# Patient Record
Sex: Male | Born: 1950 | Race: Asian | Hispanic: No | Marital: Married | Smoking: Never smoker
Health system: Southern US, Community
[De-identification: ages and names within clinical notes are randomized; demographics above are authoritative.]

## PROBLEM LIST (undated history)

## (undated) DIAGNOSIS — R21 Rash and other nonspecific skin eruption: Secondary | ICD-10-CM

## (undated) HISTORY — DX: Rash and other nonspecific skin eruption: R21

---

## 2022-03-20 ENCOUNTER — Ambulatory Visit (INDEPENDENT_AMBULATORY_CARE_PROVIDER_SITE_OTHER): Payer: 59 | Admitting: Family Medicine

## 2022-03-20 ENCOUNTER — Encounter: Payer: Self-pay | Admitting: Family Medicine

## 2022-03-20 VITALS — BP 92/60 | HR 70 | Temp 98.0°F | Ht 66.0 in | Wt 128.6 lb

## 2022-03-20 DIAGNOSIS — Z13220 Encounter for screening for lipoid disorders: Secondary | ICD-10-CM

## 2022-03-20 DIAGNOSIS — Z1211 Encounter for screening for malignant neoplasm of colon: Secondary | ICD-10-CM

## 2022-03-20 DIAGNOSIS — Z8639 Personal history of other endocrine, nutritional and metabolic disease: Secondary | ICD-10-CM | POA: Diagnosis not present

## 2022-03-20 DIAGNOSIS — R0981 Nasal congestion: Secondary | ICD-10-CM

## 2022-03-20 DIAGNOSIS — L299 Pruritus, unspecified: Secondary | ICD-10-CM

## 2022-03-20 DIAGNOSIS — Z131 Encounter for screening for diabetes mellitus: Secondary | ICD-10-CM | POA: Diagnosis not present

## 2022-03-20 DIAGNOSIS — Z23 Encounter for immunization: Secondary | ICD-10-CM

## 2022-03-20 DIAGNOSIS — R21 Rash and other nonspecific skin eruption: Secondary | ICD-10-CM

## 2022-03-20 MED ORDER — TRIAMCINOLONE ACETONIDE 0.1 % EX CREA: 1.0000 "application " | TOPICAL_CREAM | Freq: Two times a day (BID) | CUTANEOUS | 0 refills | Status: DC

## 2022-03-20 MED ORDER — FLUTICASONE PROPIONATE 50 MCG/ACT NA SUSP: 2.0000 | Freq: Every day | NASAL | 6 refills | Status: DC

## 2022-03-20 NOTE — Progress Notes (Signed)
? ?Subjective:  ?Patient ID: Chris Sutton, male    DOB: [birth year]  Age: 71 y.o. MRN: [id number] ? ?CC:  ?Chief Complaint  ?Patient presents with  ? Establish Care  ? ? ?HPI ?Chris Sutton presents for  ?New patient to establish care.  ?[place] interpreter present and pt's son Chris Sutton.  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 ?2 grandkids -2 and 5 yo grandchildren - 2 boys.  ?Prior pt of [facility]. Some concerns about prior office with labs, communication - decided to change. Last bloodwork last year.  ?Moved from [place] area of [place] few years ago.  ? ?No chronic meds, medical problems.  ?No hx of HTN, HLD, DM, Cancer.  ? ?Concerns today: ? ?In [place], had ultrasound for thyroid, liver - not done for 3 years.part of physical when living in [place].  ?Hx of thyroid nodules. No thyroid supplement used. No prior thyroid biopsies. ? ?HM: ?Screening options with colonoscopy versus Cologuard discussed. Discussed timing of repeat testing intervals if normal, as well as potential need for diagnostic Colonoscopy if positive Cologuard. Understanding expressed, and chose Cologuard. No FH of colon CA. Last screening - none ? ?Pneumonia vaccine  - today.  ? ?Nasal congestion ?Past 4-5 months, with runny nose in am.  ?Some scratchy throat, min cough.  ? ?Itching of R lower leg, stomach.  ?Past few months. ?Tx: otc lotion.   ? ?History ?There are no problems to display for this patient. ? ?Past Medical History:  ?Diagnosis Date  ? Rash   ? ?History reviewed. No pertinent surgical history. ?No Known Allergies ?Prior to Admission medications   ?Medication Sig Start Date End Date Taking? Authorizing Provider  ?CHLORPHENIRAMINE MALEATE PO Take by mouth as needed.   Yes [provider]  ?Multiple Vitamin (MULTIVITAMIN) tablet Take 1 tablet by mouth daily.   Yes [provider]  ? ?Social History  ? ?Socioeconomic History  ? Marital status: Married  ?  Spouse name: Not on file  ? Number of children: 1  ?  Years of education: Not on file  ? Highest education level: Not on file  ?Occupational History  ? Not on file  ?Tobacco Use  ? Smoking status: Never  ? Smokeless tobacco: Never  ?Vaping Use  ? Vaping Use: Never used  ?Substance and Sexual Activity  ? Alcohol use: Never  ? Drug use: Never  ? Sexual activity: Not Currently  ?Other Topics Concern  ? Not on file  ?Social History Narrative  ? Not on file  ? ?Social Determinants of Health  ? ?Financial Resource Strain: Not on file  ?Food Insecurity: Not on file  ?Transportation Needs: Not on file  ?Physical Activity: Not on file  ?Stress: Not on file  ?Social Connections: Not on file  ?Intimate Partner Violence: Not on file  ? ? ?Review of Systems ?Per HPI.  ? ?Objective:  ? ?Vitals:  ? 03/20/22 0936  ?BP: 92/60  ?Pulse: 70  ?Temp: 98 ?F (36.7 ?C)  ?TempSrc: Temporal  ?SpO2: 98%  ?Weight: 128 lb 9.6 oz (58.3 kg)  ?Height: 5\' 6"  (1.676 m)  ? ? ?Physical Exam ?Vitals reviewed.  ?Constitutional:   ?   Appearance: He is well-developed.  ?HENT:  ?   Head: Normocephalic and atraumatic.  ?   Nose:  ?   Comments: Edematous turbinates bilaterally without active discharge ?Neck:  ?   Vascular: No carotid bruit or JVD.  ?   Comments: Possible nodule/fullness left lower thyroid, nontender.  Slight asymmetry. ?Cardiovascular:  ?   Rate and Rhythm: Normal rate and regular rhythm.  ?   Heart sounds: Normal heart sounds. No murmur heard. ?Pulmonary:  ?   Effort: Pulmonary effort is normal.  ?   Breath sounds: Normal breath sounds. No rales.  ?Musculoskeletal:  ?   Right lower leg: No edema.  ?   Left lower leg: No edema.  ?Skin: ?   General: Skin is warm and dry.  ?   Comments: Faint excoriated areas lower legs bilaterally without open wounds, few small papular, excoriated areas along the abdominal wall.  No open wounds or surrounding erythema.  ?Neurological:  ?   Mental Status: He is alert and oriented to person, place, and time.  ?Psychiatric:     ?   Mood and Affect: Mood normal.   ? ? ? ? ? ?Assessment & Plan:  ?[name] is a 71 y.o. male . ?History of thyroid nodule - Plan: TSH, US THYROID ? -Thyroid nodules by history, check updated TSH, ultrasound ordered. ? ?Screening for diabetes mellitus - Plan: Comprehensive metabolic panel, Hemoglobin A1c ? ?Screening for hyperlipidemia - Plan: Lipid panel ? ?Screen for colon cancer - Plan: Cologuard ? ?Need for pneumococcal vaccination - Plan: Pneumococcal conjugate vaccine 20-valent (Prevnar 20) ? ?Nasal congestion - Plan: fluticasone (FLONASE) 50 MCG/ACT nasal spray ? -Recommended consistent use of Flonase for at least a few weeks to see if that may be more beneficial than short-term trial previously.  RTC precautions if persistent symptoms.  Could consider Atrovent nasal spray as well. ? ?Itching - Plan: triamcinolone cream (KENALOG) 0.1 % ?Rash and nonspecific skin eruption - Plan: triamcinolone cream (KENALOG) 0.1 % ? -Possible dry skin dermatitis.  No sign of infection presently, hydrating lotion discussed with Aveeno or Eucerin, triamcinolone cream if needed temporarily for pruritus.  RTC precautions if persistent. ? ?Recheck 1 month ? ?Meds ordered this encounter  ?Medications  ? fluticasone (FLONASE) 50 MCG/ACT nasal spray  ?  Sig: Place 2 sprays into both nostrils daily.  ?  Dispense:  16 g  ?  Refill:  6  ? triamcinolone cream (KENALOG) 0.1 %  ?  Sig: Apply 1 application. topically 2 (two) times daily.  ?  Dispense:  30 g  ?  Refill:  0  ? ?Patient Instructions  ?Lab visit tomorrow.  ?I will order ultrasound for thyroid nodules, cologuard for colon cancer screening - will be sent to your home.  ?Try flonase nasal spray, for at least 2 weeks. If not helping congestion, return to discuss other options.  ?Aveeno lotion to itching areas, start claritin once per day. Steroid cream if needed.  ?Recheck itching, congestion in next 1 month.  ?Nice meeting you today. Please let me know if you have questions before next visit.  ? ? ? ? ?Signed,   ? ?Meredith Staggers, MD ?[facility], [facility] ?[facility] ?03/20/22 ?10:31 AM ? ? ?

## 2022-03-20 NOTE — Patient Instructions (Addendum)
Lab visit tomorrow.  ?I will order ultrasound for thyroid nodules, cologuard for colon cancer screening - will be sent to your home.  ?Try flonase nasal spray, for at least 2 weeks. If not helping congestion, return to discuss other options.  ?Aveeno lotion to itching areas, start claritin once per day. Steroid cream if needed.  ?Recheck itching, congestion in next 1 month.  ?Nice meeting you today. Please let me know if you have questions before next visit.  ? ?

## 2022-03-21 ENCOUNTER — Other Ambulatory Visit (INDEPENDENT_AMBULATORY_CARE_PROVIDER_SITE_OTHER): Payer: 59

## 2022-03-21 DIAGNOSIS — Z131 Encounter for screening for diabetes mellitus: Secondary | ICD-10-CM | POA: Diagnosis not present

## 2022-03-21 DIAGNOSIS — Z13220 Encounter for screening for lipoid disorders: Secondary | ICD-10-CM

## 2022-03-21 DIAGNOSIS — Z8639 Personal history of other endocrine, nutritional and metabolic disease: Secondary | ICD-10-CM

## 2022-03-21 LAB — COMPREHENSIVE METABOLIC PANEL
ALT: 15 U/L (ref 0–53)
AST: 22 U/L (ref 0–37)
Albumin: 4.4 g/dL (ref 3.5–5.2)
Alkaline Phosphatase: 25 U/L — ABNORMAL LOW (ref 39–117)
BUN: 14 mg/dL (ref 6–23)
CO2: 31 mEq/L (ref 19–32)
Calcium: 9.4 mg/dL (ref 8.4–10.5)
Chloride: 100 mEq/L (ref 96–112)
Creatinine, Ser: 0.85 mg/dL (ref 0.40–1.50)
GFR: 88.99 mL/min (ref >60.00)
Glucose, Bld: 100 mg/dL — ABNORMAL HIGH (ref 70–99)
Potassium: 4.1 mEq/L (ref 3.5–5.1)
Sodium: 138 mEq/L (ref 135–145)
Total Bilirubin: 0.9 mg/dL (ref 0.2–1.2)
Total Protein: 7.7 g/dL (ref 6.0–8.3)

## 2022-03-21 LAB — LIPID PANEL
Cholesterol: 167 mg/dL (ref 0–200)
HDL: 56.1 mg/dL (ref >39.00)
LDL Cholesterol: 97 mg/dL (ref 0–99)
NonHDL: 110.99
Total CHOL/HDL Ratio: 3
Triglycerides: 72 mg/dL (ref 0.0–149.0)
VLDL: 14.4 mg/dL (ref 0.0–40.0)

## 2022-03-21 LAB — HEMOGLOBIN A1C: Hgb A1c MFr Bld: 6 % (ref 4.6–6.5)

## 2022-03-21 LAB — TSH: TSH: 0.83 u[IU]/mL (ref 0.35–5.50)

## 2022-03-21 NOTE — Addendum Note (Signed)
Addended by: Merri Ray R on: 03/21/2022 12:41 PM ? ? Modules accepted: Orders ? ?

## 2022-03-22 ENCOUNTER — Encounter: Payer: Self-pay | Admitting: Family Medicine

## 2022-03-22 ENCOUNTER — Other Ambulatory Visit: Payer: Self-pay

## 2022-03-22 ENCOUNTER — Ambulatory Visit (INDEPENDENT_AMBULATORY_CARE_PROVIDER_SITE_OTHER): Payer: 59 | Admitting: Family Medicine

## 2022-03-22 ENCOUNTER — Emergency Department (INDEPENDENT_AMBULATORY_CARE_PROVIDER_SITE_OTHER): Payer: 59

## 2022-03-22 ENCOUNTER — Emergency Department
Admission: EM | Admit: 2022-03-22 | Discharge: 2022-03-22 | Disposition: A | Discharge status: Home / Self Care | Payer: 59 | Source: Home / Self Care

## 2022-03-22 VITALS — BP 112/74 | HR 76 | Temp 98.1°F | Ht 66.0 in | Wt 129.5 lb

## 2022-03-22 DIAGNOSIS — M62838 Other muscle spasm: Secondary | ICD-10-CM

## 2022-03-22 DIAGNOSIS — M542 Cervicalgia: Secondary | ICD-10-CM

## 2022-03-22 DIAGNOSIS — M545 Low back pain, unspecified: Secondary | ICD-10-CM

## 2022-03-22 MED ORDER — CELECOXIB 100 MG PO CAPS: 100.0000 mg | ORAL_CAPSULE | Freq: Two times a day (BID) | ORAL | 0 refills | Status: AC

## 2022-03-22 MED ORDER — METHYLPREDNISOLONE 4 MG PO TBPK: ORAL_TABLET | ORAL | 0 refills | Status: DC

## 2022-03-22 MED ORDER — BACLOFEN 10 MG PO TABS: 10.0000 mg | ORAL_TABLET | Freq: Three times a day (TID) | ORAL | 0 refills | Status: DC
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
## 2022-03-22 NOTE — Patient Instructions (Signed)
It was very nice to see you today! ? ?Ice packs.  Heat Sunday.   ?Tylenol(acetaminophen) if needed.  ?Naproxen 1 tab twice daily.- no matter what.  ? ?No heavy lifting. ? ? ?PLEASE NOTE: ? ?If you had any lab tests please let us know if you have not heard back within a few days. You may see your results on MyChart before we have a chance to review them but we will give you a call once they are reviewed by [place]. If we ordered any referrals today, please let us know if you have not heard from their office within the next week.  ? ?Please try these tips to maintain a healthy lifestyle: ? ?Eat most of your calories during the day when you are active. Eliminate processed foods including packaged sweets (pies, cakes, cookies), reduce intake of potatoes, white bread, white pasta, and white rice. Look for whole grain options, oat flour or almond flour. ? ?Each meal should contain half fruits/vegetables, one quarter protein, and one quarter carbs (no bigger than a computer mouse). ? ?Cut down on sweet beverages. This includes juice, soda, and sweet tea. Also watch fruit intake, though this is a healthier sweet option, it still contains natural sugar! Limit to 3 servings daily. ? ?Drink at least 1 glass of water with each meal and aim for at least 8 glasses per day ? ?Exercise at least 150 minutes every week.  ice ?

## 2022-03-22 NOTE — Discharge Instructions (Addendum)
Advised/informed patient and son of cervical spine x-ray results this evening with hard copy provided.  Advised patient and son to take medication as directed with food to completion.  Advised patient/son to start Medrol Dosepak tomorrow morning, Saturday, 03/23/2022.  Advised patient may take 1 Celebrex and 1 baclofen tonight.  Beginning tomorrow morning Saturday, 03/23/2022 advised patient/son to take Medrol Dosepak with first dose of Celebrex for the next 5 of 15 days.  Advised may take Baclofen daily or as needed for accompanying muscle spasms of neck.  Encouraged patient to increase daily water intake while taking these medications.  Advised patient/son if symptoms worsen and/or unresolved please follow-up with PCP or here for further evaluation.  ?
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
## 2022-03-25 NOTE — Telephone Encounter (Signed)
Reviewed notes.  Glad the x-ray was ok.  Hopefully he gets feeling better ?

## 2022-03-29 ENCOUNTER — Ambulatory Visit (HOSPITAL_BASED_OUTPATIENT_CLINIC_OR_DEPARTMENT_OTHER)
Admission: RE | Admit: 2022-03-29 | Discharge: 2022-03-29 | Disposition: A | Discharge status: Home / Self Care | Payer: 59 | Source: Ambulatory Visit | Attending: Family Medicine | Admitting: Family Medicine

## 2022-03-29 DIAGNOSIS — E042 Nontoxic multinodular goiter: Secondary | ICD-10-CM | POA: Insufficient documentation

## 2022-03-29 DIAGNOSIS — Z8639 Personal history of other endocrine, nutritional and metabolic disease: Secondary | ICD-10-CM | POA: Diagnosis present

## 2022-04-07 LAB — COLOGUARD: COLOGUARD: NEGATIVE

## 2022-04-09 ENCOUNTER — Telehealth: Payer: Self-pay

## 2022-04-09 NOTE — Telephone Encounter (Signed)
Pt son is seeking a new provider, is asking if you would take him on.  ?Adlai Sinning call back at [phone] ? ?

## 2022-04-09 NOTE — Telephone Encounter (Signed)
Yes, I will agree to see him. Ok to schedule new pt appt - 100min. Thanks.  ?

## 2022-04-10 NOTE — Telephone Encounter (Signed)
Pt son is to be a new patient or Dr Neva Seat, Karleen Hampshire and can be reached at [phone] ?

## 2022-04-17 ENCOUNTER — Encounter: Payer: Self-pay | Admitting: Family Medicine

## 2022-04-17 ENCOUNTER — Ambulatory Visit (INDEPENDENT_AMBULATORY_CARE_PROVIDER_SITE_OTHER): Payer: 59 | Admitting: Family Medicine

## 2022-04-17 VITALS — BP 128/70 | HR 82 | Temp 98.1°F | Resp 17 | Ht 66.0 in | Wt 126.8 lb

## 2022-04-17 DIAGNOSIS — R0981 Nasal congestion: Secondary | ICD-10-CM

## 2022-04-17 DIAGNOSIS — Z8639 Personal history of other endocrine, nutritional and metabolic disease: Secondary | ICD-10-CM

## 2022-04-17 DIAGNOSIS — R7303 Prediabetes: Secondary | ICD-10-CM

## 2022-04-17 DIAGNOSIS — L299 Pruritus, unspecified: Secondary | ICD-10-CM | POA: Diagnosis not present

## 2022-04-17 DIAGNOSIS — R21 Rash and other nonspecific skin eruption: Secondary | ICD-10-CM | POA: Diagnosis not present

## 2022-04-17 MED ORDER — FLUTICASONE PROPIONATE 50 MCG/ACT NA SUSP: 2.0000 | Freq: Every day | NASAL | 2 refills | Status: DC

## 2022-04-17 NOTE — Patient Instructions (Addendum)
Restart flonase for allergies. Follow up if not improved on this medicine.  ?Recheck prediabetes test in 6 months.   ?Eucerin up to few times per day for dry skin.  Here are other recommendations for dry skin: ?Drink at least 64 ounces of water daily. ?Consider a humidifier for the room where you sleep. ?Bathe once daily. Avoid using HOT water, as it dries skin.  ?Avoid deodorant soaps (Dial is the worst!) and stick with gentle cleansers (I like Cetaphil Liquid Cleanser). ?After bathing, dry off completely, then apply a thick emollient cream (I like Cetaphil Moisturizing Cream or Eucerin). ?Apply the cream twice daily, or more! ? ?Thanks for coming in today and have a safe trip.  ?

## 2022-04-17 NOTE — Progress Notes (Signed)
? ?Subjective:  ?Patient ID: Chris Sutton, male    DOB: 1951/05/06  Age: 71 y.o. MRN: [id number] ? ?CC:  ?Chief Complaint  ?Patient presents with  ? Nasal Congestion  ?  Pt reports was out of flonase and did not realize he has refills on medication so he has been unable to trial this sxs are about the same   ? Rash  ?  Pt reports has been using eucerin but still does have some dry areas he did not use the triamcinolone cream  ? Results  ?  Discuss lab results   ? ? ?HPI ?Chris Sutton presents for  ? ?Here with interpreter.  ? ?Allergic rhinitis ?Discussed nasal congestion last visit at his establish care appointment.  4 to 5 months.  Some scratchy throat.  Suspected allergy component.  Consistent use of Flonase recommended.  Better with flonase, worse off flonase.  Plans to get refill. Option of Atrovent nasal spray if not improved. Plans to go to [place] for 3 months.  ? ?Dry skin dermatitis ?Discussed last visit.  Eucerin recommended, with option of triamcinolone topical for pruritic areas. Less itching - only using moisturizing cream. Doing better with humidity.  ? ?Labs from last visit overall reassuring with A1c at prediabetic level at 6.0, but overall CMP, lipid panel, TSH were normal. ? ?Thyroid nodules: ?Ultrasound 03/29/22: ?IMPRESSION: ?1. Nonenlarged thyroid gland ?2. Sub-1 cm mixed cystic and solid (TR-2) nodules. None meet ?threshold for follow-up nor biopsy per current criteria. ? ?Prediabetes: ?Diet/exercise discussed with repeat testing in 6 months. Not eating sweets. ?Has cut back on rice ?Lab Results  ?Component Value Date  ? HGBA1C 6.0 03/21/2022  ? ?Wt Readings from Last 3 Encounters:  ?04/17/22 126 lb 12.8 oz (57.5 kg)  ?03/22/22 129 lb 8 oz (58.7 kg)  ?03/20/22 128 lb 9.6 oz (58.3 kg)  ? ?HM: ?Cologuard neg 04/01/20.  ? ?History ?There are no problems to display for this patient. ? ?Past Medical History:  ?Diagnosis Date  ? Rash   ? ?No past surgical history on file. ?No Known Allergies ?Prior to  Admission medications   ?Medication Sig Start Date End Date Taking? Authorizing Provider  ?fluticasone (FLONASE) 50 MCG/ACT nasal spray Place 2 sprays into both nostrils daily. 03/20/22  Yes Wendie Agreste, MD  ?Multiple Vitamin (MULTIVITAMIN) tablet Take 1 tablet by mouth daily.   Yes [provider]  ?baclofen (LIORESAL) 10 MG tablet Take 1 tablet (10 mg total) by mouth 3 (three) times daily. ?Patient not taking: Reported on 04/17/2022 03/22/22   Eliezer Lofts, FNP  ?triamcinolone cream (KENALOG) 0.1 % Apply 1 application. topically 2 (two) times daily. ?Patient not taking: Reported on 04/17/2022 03/20/22   Wendie Agreste, MD  ? ?Social History  ? ?Socioeconomic History  ? Marital status: Married  ?  Spouse name: Not on file  ? Number of children: 1  ? Years of education: Not on file  ? Highest education level: Not on file  ?Occupational History  ? Not on file  ?Tobacco Use  ? Smoking status: Never  ? Smokeless tobacco: Never  ?Vaping Use  ? Vaping Use: Never used  ?Substance and Sexual Activity  ? Alcohol use: Never  ? Drug use: Never  ? Sexual activity: Not Currently  ?Other Topics Concern  ? Not on file  ?Social History Narrative  ? Not on file  ? ?Social Determinants of Health  ? ?Financial Resource Strain: Not on file  ?Food Insecurity: Not on file  ?  Transportation Needs: Not on file  ?Physical Activity: Not on file  ?Stress: Not on file  ?Social Connections: Not on file  ?Intimate Partner Violence: Not on file  ? ? ?Review of Systems ? ? ?Objective:  ? ?Vitals:  ? 04/17/22 0820  ?BP: 128/70  ?Pulse: 82  ?Resp: 17  ?Temp: 98.1 ?F (36.7 ?C)  ?TempSrc: Temporal  ?SpO2: 97%  ?Weight: 126 lb 12.8 oz (57.5 kg)  ?Height: 5\' 6"  (1.676 m)  ? ? ? ?Physical Exam ?Vitals reviewed.  ?Constitutional:   ?   Appearance: He is well-developed.  ?HENT:  ?   Head: Normocephalic and atraumatic.  ?Neck:  ?   Vascular: No carotid bruit or JVD.  ?Cardiovascular:  ?   Rate and Rhythm: Normal rate and regular rhythm.  ?    Heart sounds: Normal heart sounds. No murmur heard. ?Pulmonary:  ?   Effort: Pulmonary effort is normal.  ?   Breath sounds: Normal breath sounds. No rales.  ?Musculoskeletal:  ?   Right lower leg: No edema.  ?   Left lower leg: No edema.  ?Skin: ?   General: Skin is warm and dry.  ?Neurological:  ?   Mental Status: He is alert and oriented to person, place, and time.  ?Psychiatric:     ?   Mood and Affect: Mood normal.  ? ? ?Assessment & Plan:  ?Chris Sutton is a 71 y.o. male . ?Rash and nonspecific skin eruption ? -Dry skin care discussed.  Has Eucerin, triamcinolone if needed.  RTC precautions ? ?Nasal congestion - Plan: fluticasone (FLONASE) 50 MCG/ACT nasal spray ? -Compliance with Flonase discussed.  90-month prescription given as he will be traveling out of country.  If symptoms are improved can stop use of Flonase.  Potential side effects and risks discussed. ? ?Itching ?As above, triamcinolone as needed ? ?Prediabetes ? -No concerns based on weight, diet at this time.  Recheck levels in 6 months.  Continue healthy eating.  Remain active. ? ?History of thyroid nodule ?Ultrasound as above, nodule did not meet criteria for biopsy.  Consider repeat ultrasound next year or so. ? ?No orders of the defined types were placed in this encounter. ? ?Patient Instructions  ?Restart flonase for allergies. Follow up if not improved on this medicine.  ?Recheck prediabetes test in 6 months.   ?Eucerin up to few times per day for dry skin.  Here are other recommendations for dry skin: ?Drink at least 64 ounces of water daily. ?Consider a humidifier for the room where you sleep. ?Bathe once daily. Avoid using HOT water, as it dries skin.  ?Avoid deodorant soaps (Dial is the worst!) and stick with gentle cleansers (I like Cetaphil Liquid Cleanser). ?After bathing, dry off completely, then apply a thick emollient cream (I like Cetaphil Moisturizing Cream or Eucerin). ?Apply the cream twice daily, or more! ? ?Thanks for coming in  today and have a safe trip.  ? ? ? ?Signed,  ? ?Merri Ray, MD ?[place], [facility] ?[facility] ?04/17/22 ?9:02 AM ? ? ?

## 2022-04-19 ENCOUNTER — Ambulatory Visit: Payer: 59 | Admitting: Family Medicine

## 2022-10-17 ENCOUNTER — Ambulatory Visit (INDEPENDENT_AMBULATORY_CARE_PROVIDER_SITE_OTHER): Payer: Commercial Managed Care - HMO | Admitting: Family Medicine

## 2022-10-17 ENCOUNTER — Ambulatory Visit (INDEPENDENT_AMBULATORY_CARE_PROVIDER_SITE_OTHER)
Admission: RE | Admit: 2022-10-17 | Discharge: 2022-10-17 | Disposition: A | Discharge status: Home / Self Care | Payer: Commercial Managed Care - HMO | Source: Ambulatory Visit | Attending: Family Medicine | Admitting: Family Medicine

## 2022-10-17 ENCOUNTER — Encounter: Payer: Self-pay | Admitting: Family Medicine

## 2022-10-17 VITALS — BP 112/68 | HR 60 | Temp 98.1°F | Wt 128.0 lb

## 2022-10-17 DIAGNOSIS — R2241 Localized swelling, mass and lump, right lower limb: Secondary | ICD-10-CM

## 2022-10-17 DIAGNOSIS — R7303 Prediabetes: Secondary | ICD-10-CM | POA: Diagnosis not present

## 2022-10-17 DIAGNOSIS — B349 Viral infection, unspecified: Secondary | ICD-10-CM

## 2022-10-17 DIAGNOSIS — L299 Pruritus, unspecified: Secondary | ICD-10-CM

## 2022-10-17 DIAGNOSIS — R0981 Nasal congestion: Secondary | ICD-10-CM | POA: Diagnosis not present

## 2022-10-17 DIAGNOSIS — Z23 Encounter for immunization: Secondary | ICD-10-CM

## 2022-10-17 DIAGNOSIS — R21 Rash and other nonspecific skin eruption: Secondary | ICD-10-CM | POA: Diagnosis not present

## 2022-10-17 LAB — BASIC METABOLIC PANEL
BUN: 15 mg/dL (ref 6–23)
CO2: 31 mEq/L (ref 19–32)
Calcium: 9.7 mg/dL (ref 8.4–10.5)
Chloride: 101 mEq/L (ref 96–112)
Creatinine, Ser: 0.82 mg/dL (ref 0.40–1.50)
GFR: 89.6 mL/min (ref >60.00)
Glucose, Bld: 114 mg/dL — ABNORMAL HIGH (ref 70–99)
Potassium: 4.5 mEq/L (ref 3.5–5.1)
Sodium: 137 mEq/L (ref 135–145)

## 2022-10-17 LAB — HEMOGLOBIN A1C: Hgb A1c MFr Bld: 6.2 % (ref 4.6–6.5)

## 2022-10-17 MED ORDER — TRIAMCINOLONE ACETONIDE 0.1 % EX CREA: 1.0000 | TOPICAL_CREAM | Freq: Two times a day (BID) | CUTANEOUS | 5 refills | Status: AC

## 2022-10-17 MED ORDER — FLUTICASONE PROPIONATE 50 MCG/ACT NA SUSP: 2.0000 | Freq: Every day | NASAL | 6 refills | Status: AC

## 2022-10-17 NOTE — Patient Instructions (Addendum)
Possible virus 10 days ago. Glad you are better.  Continue flonase for allergies.  Aveeno or Eucerin lotion 1-2 times per day and anytime there is a dry/itchy spot. Triamcinolone up to 2 times per day if needed for itching.  Follow up if worsening.   Please have x-ray performed at [facility] location below.  Depending on that result I will likely refer you to orthopedics.  Flu vaccine given today.   [place] for [employer].  Walk in 8:30-4:30 during weekdays, no appointment needed [place].  [place], [place] [address]

## 2022-10-17 NOTE — Progress Notes (Signed)
Subjective:  Patient ID: Chris Sutton, male    DOB: October 03, 1951  Age: 71 y.o. MRN: [id number]  CC:  Chief Complaint  Patient presents with   Sinus Problem    Nasal spray is not working he is using once a day because he was using 2 times a day and had issues    Recurrent Skin Infections    Pt has itching all over his body and has tried varies lotions with no relief     HPI Chris Sutton presents for   Follow up. Interpreter present. Mandarin.   Allergic rhinitis Discussed at his March and April visits.  Consistent use of Flonase recommended at that time.  Symptoms were improvement with Flonase.  Option of Atrovent nasal spray if not improved.  Using flonase once per day - stable if daily use.  Recent flu like symptoms about 10 days ago. No covid or flu testing. Improving. Slight cough but otherwise better. Runny nose, congestion, headache initially. Grandchild was sick.  Plan flu vaccine today.   Prediabetes: No home readings.  Had physical in [place] in May, blood sugar elevated at that time.  ? A1c 6.0 Lab Results  Component Value Date   HGBA1C 6.0 03/21/2022   Wt Readings from Last 3 Encounters:  10/17/22 128 lb (58.1 kg)  04/17/22 126 lb 12.8 oz (57.5 kg)  03/22/22 129 lb 8 oz (58.7 kg)   Rash Suspected dry skin dermatitis component when discussed in April.  Eucerin, triamcinolone has been prescribed previously. Better in [place]. Itching has returned, but only minimal at this time.  Triamcinolone cream every night.   Fasting today  Hard lump R knee: Present 5-6 years.  No injury. Getting bigger past few years. MRI in [place] in 2018 - nothing serious.  Only sore with walking long distances.    History There are no problems to display for this patient.  Past Medical History:  Diagnosis Date   Rash    No past surgical history on file. No Known Allergies Prior to Admission medications   Medication Sig Start Date End Date Taking? Authorizing Provider  fluticasone  (FLONASE) 50 MCG/ACT nasal spray Place 2 sprays into both nostrils daily. 04/17/22  Yes Shade Flood, MD  Multiple Vitamin (MULTIVITAMIN) tablet Take 1 tablet by mouth daily.   Yes [provider]  triamcinolone cream (KENALOG) 0.1 % Apply 1 application. topically 2 (two) times daily. Patient not taking: Reported on 04/17/2022 03/20/22   Shade Flood, MD   Social History   Socioeconomic History   Marital status: Married    Spouse name: Not on file   Number of children: 1   Years of education: Not on file   Highest education level: Not on file  Occupational History   Not on file  Tobacco Use   Smoking status: Never   Smokeless tobacco: Never  Vaping Use   Vaping Use: Never used  Substance and Sexual Activity   Alcohol use: Never   Drug use: Never   Sexual activity: Not Currently  Other Topics Concern   Not on file  Social History Narrative   Not on file   Social Determinants of Health   Financial Resource Strain: Not on file  Food Insecurity: Not on file  Transportation Needs: Not on file  Physical Activity: Not on file  Stress: Not on file  Social Connections: Not on file  Intimate Partner Violence: Not on file    Review of Systems  Per HPI.  Objective:  Vitals:   10/17/22 0839  BP: 112/68  Pulse: 60  Temp: 98.1 F (36.7 C)  SpO2: 99%  Weight: 128 lb (58.1 kg)     Physical Exam Vitals reviewed.  Constitutional:      Appearance: He is well-developed.  HENT:     Head: Normocephalic and atraumatic.  Neck:     Vascular: No carotid bruit or JVD.  Cardiovascular:     Rate and Rhythm: Normal rate and regular rhythm.     Heart sounds: Normal heart sounds. No murmur heard. Pulmonary:     Effort: Pulmonary effort is normal.     Breath sounds: Normal breath sounds. No rales.  Musculoskeletal:     Right lower leg: No edema.     Left lower leg: No edema.     Comments: Right knee pain-free range of motion, no joint line tenderness.  Firm  somewhat mobile lesion approximately 1/2 to 2 cm at the superior medial right knee.  Lateral and superior to patella.  Skin:    General: Skin is warm and dry.     Comments: Few areas of dry skin on lower legs, right knee with slight excoriation.  No vesicles or other rash appreciated.  Neurological:     Mental Status: He is alert and oriented to person, place, and time.  Psychiatric:        Mood and Affect: Mood normal.        Assessment & Plan:  Chris Sutton is a 71 y.o. male . Prediabetes - Plan: Basic metabolic panel, Hemoglobin A1c  -Check A1c, continue to watch diet, activity.  Need for influenza vaccination - Plan: Flu Vaccine QUAD High Dose(Fluad)  Itching - Plan: triamcinolone cream (KENALOG) 0.1 % Rash and nonspecific skin eruption - Plan: triamcinolone cream (KENALOG) 0.1 %  -Still suspect component of dry skin dermatitis, eczema possible.  Continue triamcinolone to affected areas, hydrating lotion at least twice per day.  RTC precautions and consider dermatology eval.  Nasal congestion - Plan: fluticasone (FLONASE) 50 MCG/ACT nasal spray  -Well-controlled with Flonase, continue lowest effective dose  Viral illness  -Recent viral illness likely, unknown if flu or COVID as needed testing but either way has improved, will defer testing at this time.  RTC precautions if worsening.  Minimal cough at this time and reassuring exam.  Knee mass, right - Plan: DG Knee Complete 4 Views Right  -Chronic mass per patient but increasing in size.  We will start with plain films, then likely Ortho eval to determine if MRI or CT to evaluate further.  Meds ordered this encounter  Medications   triamcinolone cream (KENALOG) 0.1 %    Sig: Apply 1 Application topically 2 (two) times daily.    Dispense:  45 g    Refill:  5   fluticasone (FLONASE) 50 MCG/ACT nasal spray    Sig: Place 2 sprays into both nostrils daily.    Dispense:  48 g    Refill:  6   Patient Instructions  Possible  virus 10 days ago. Glad you are better.  Continue flonase for allergies.  Aveeno or Eucerin lotion 1-2 times per day and anytime there is a dry/itchy spot. Triamcinolone up to 2 times per day if needed for itching.  Follow up if worsening.   Please have x-ray performed at [facility] location below.  Depending on that result I will likely refer you to orthopedics.  Flu vaccine given today.   [place] for [employer].  Walk in 8:30-4:30  during weekdays, no appointment needed [address]  [place], [place] [address]             Signed,   Meredith Staggers, MD [facility], [facility] 10/17/22 8:50 AM

## 2022-10-22 ENCOUNTER — Telehealth: Payer: Self-pay

## 2022-10-22 NOTE — Telephone Encounter (Signed)
-----   Message from Wendie Agreste, MD sent at 10/18/2022  4:23 PM EDT ----- Please call with interpreter.  X-ray showed a rounded area on the side of the knee, corresponds to what we pressed on exam.  Could be calcified bursa or fluid-filled sac or loose body.  Would recommend follow-up with orthopedist.  Let me know if I can place that referral.

## 2022-10-23 NOTE — Telephone Encounter (Signed)
Another call attempted no answer again had interpreter leave another message

## 2022-10-24 ENCOUNTER — Telehealth: Payer: Self-pay

## 2022-10-24 DIAGNOSIS — Z20818 Contact with and (suspected) exposure to other bacterial communicable diseases: Secondary | ICD-10-CM

## 2022-10-24 DIAGNOSIS — R2241 Localized swelling, mass and lump, right lower limb: Secondary | ICD-10-CM

## 2022-10-24 DIAGNOSIS — J029 Acute pharyngitis, unspecified: Secondary | ICD-10-CM

## 2022-10-24 IMAGING — US US THYROID
1 series · 14 of 25 positions shown · non-contrast
Comparison: No comparison US Thyroid.  C-spine XRs, 03/22/2022.

CLINICAL DATA: Other.  Reported history of thyroid nodules.

EXAM:
THYROID ULTRASOUND
TECHNIQUE: Ultrasound examination of the thyroid gland and adjacent soft
tissues was performed.

[Series 1: us thyroid · 14 of 98 slices shown]
[im 1/98]
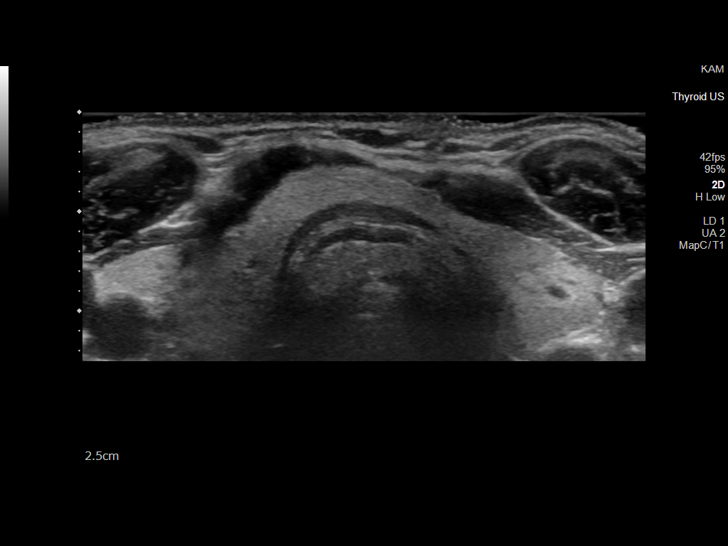
[im 9/98]
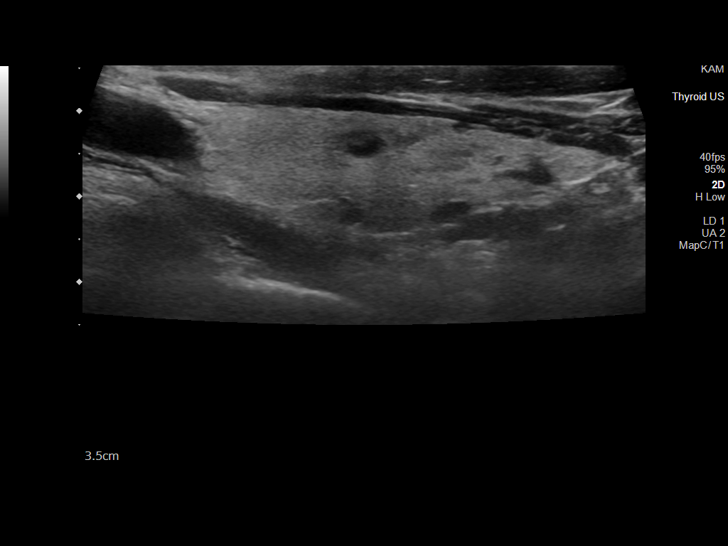
[im 17/98]
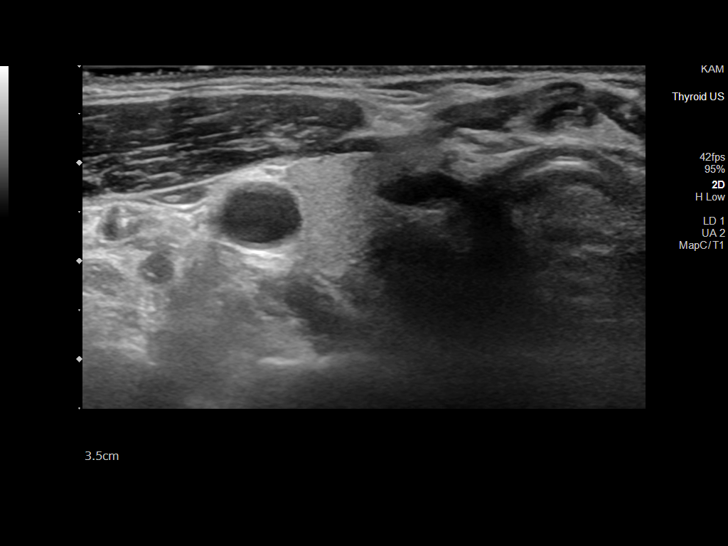
[im 25/98]
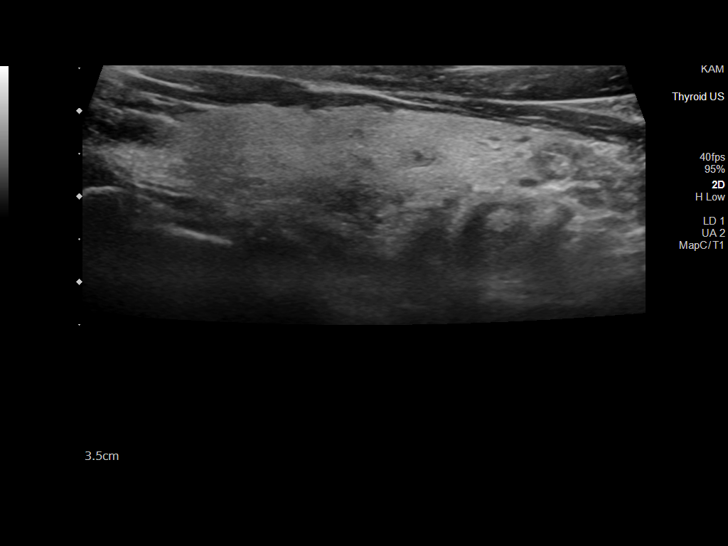
[im 33/98]
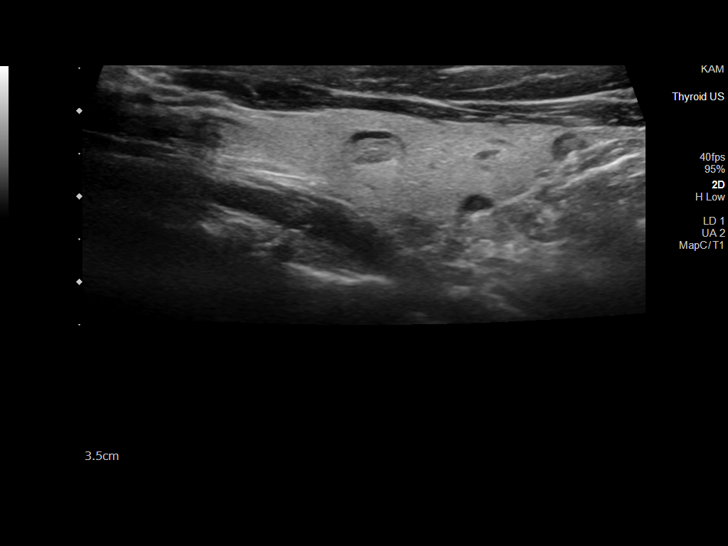
[im 37/98]
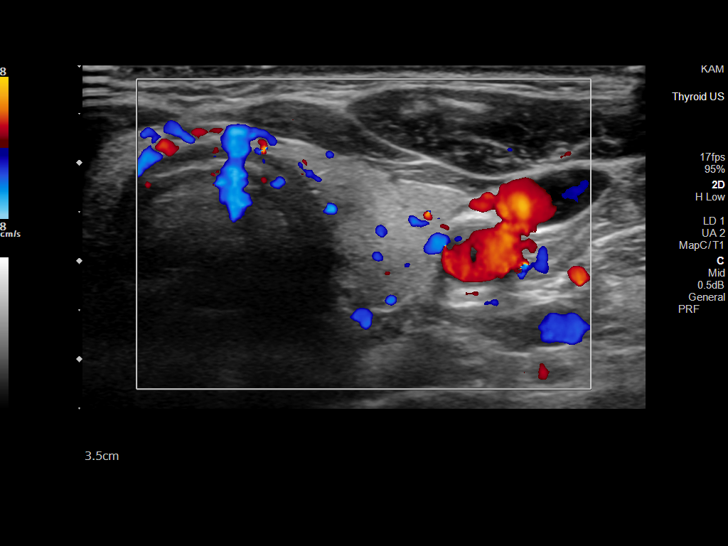
[im 45/98]
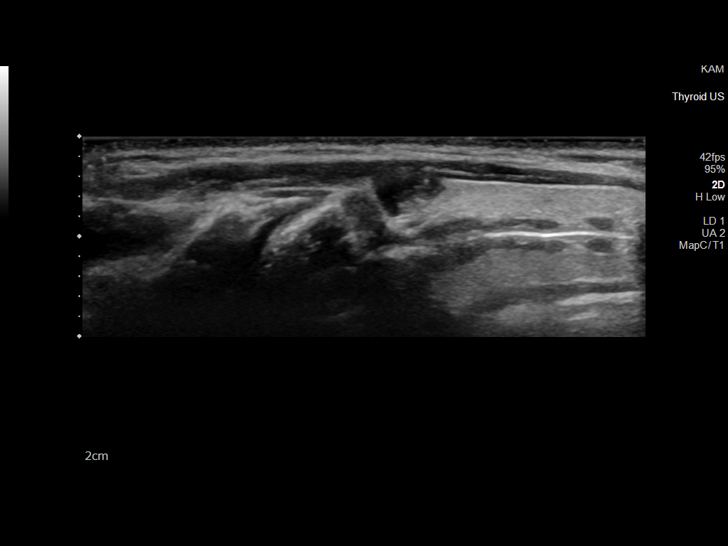
[im 53/98]
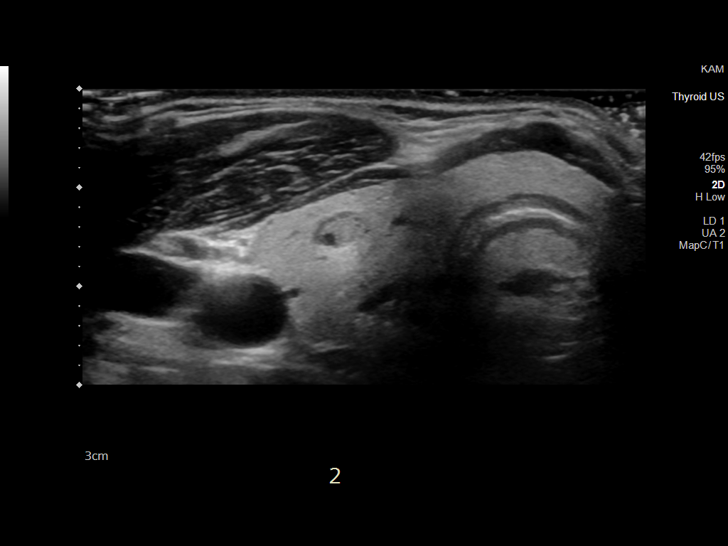
[im 61/98]
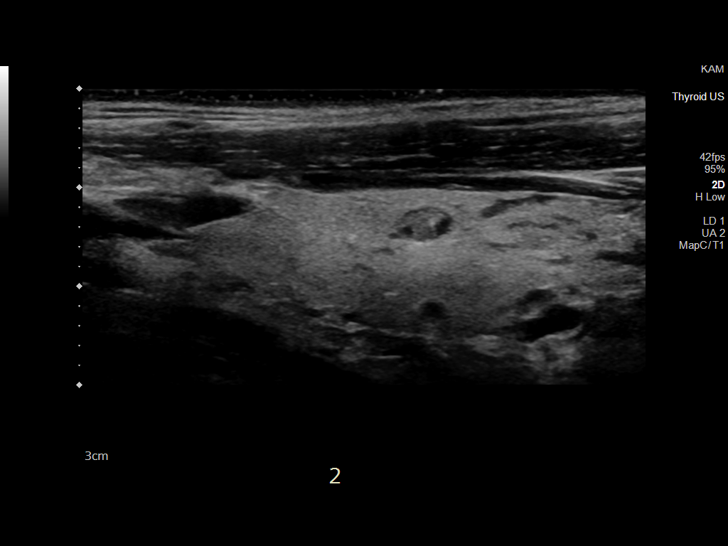
[im 65/98]
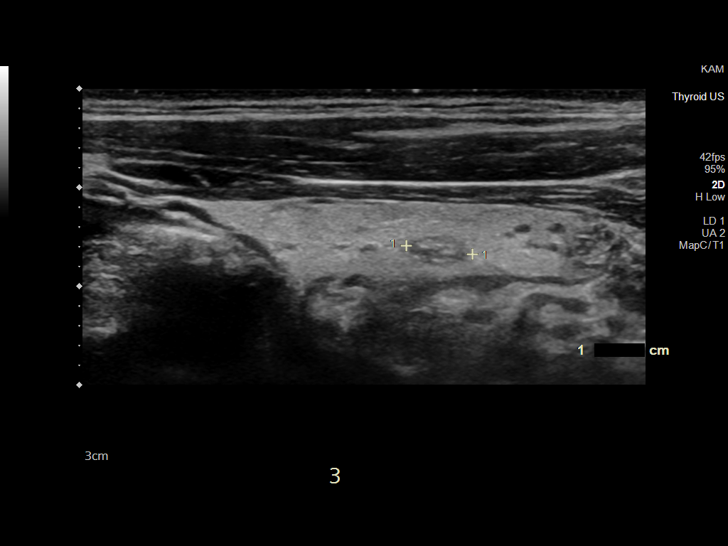
[im 73/98]
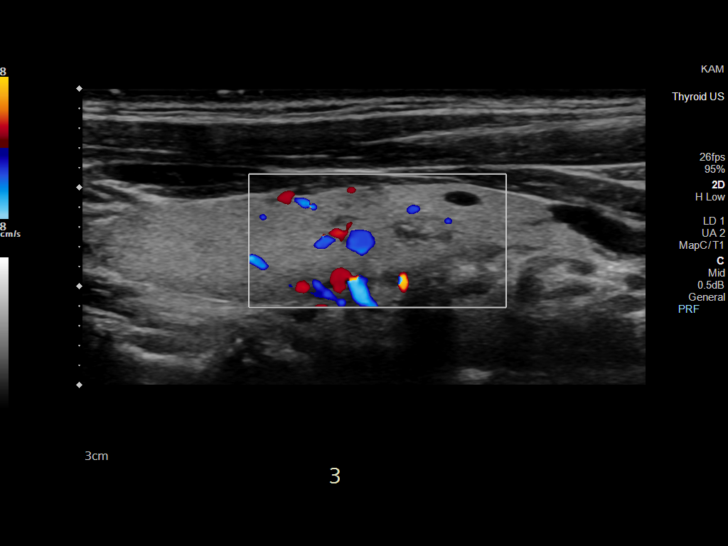
[im 81/98]
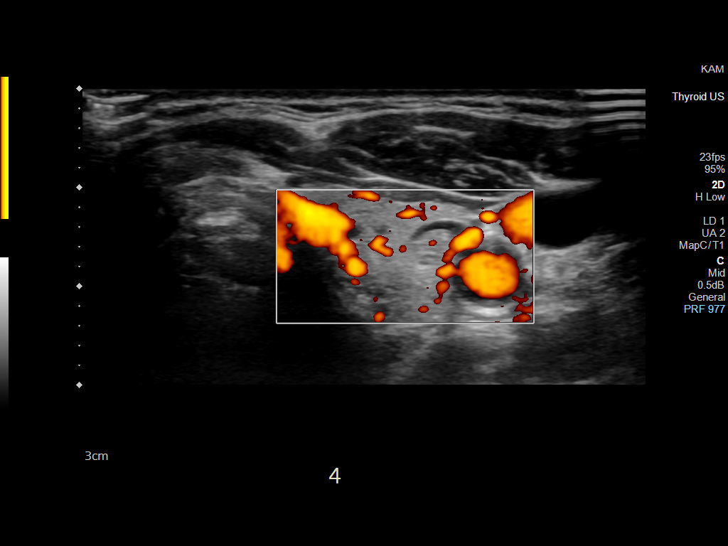
[im 89/98]
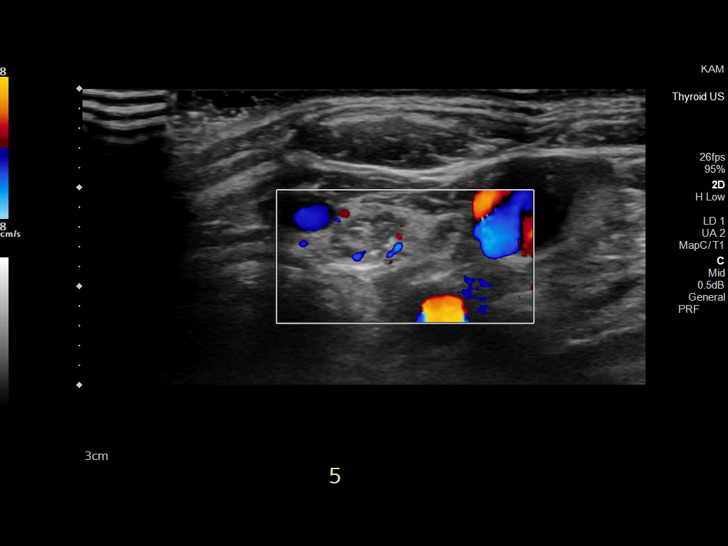
[im 98/98]
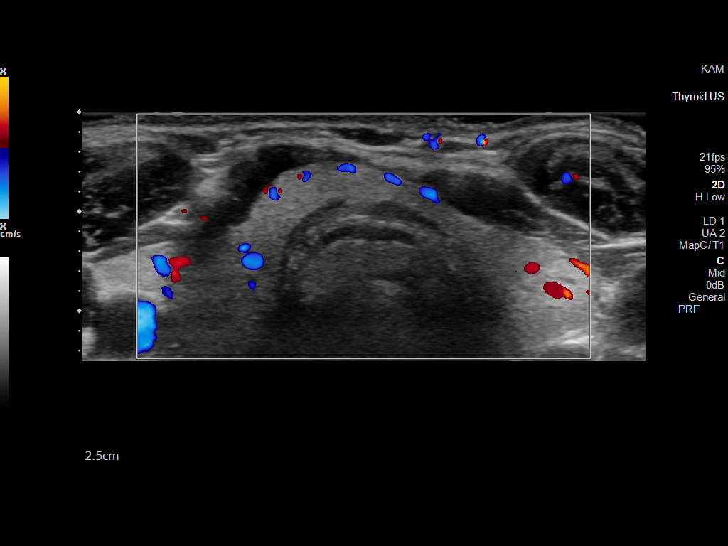

[14 of 25 positions shown; findings below may reference images not displayed]

FINDINGS: Parenchymal Echotexture: Normal

Isthmus: 0.4 cm

Right lobe: 5.2 x 1.5 x 7 cm

Left lobe: 5.6 x 1.5 x 1.9 cm

_________________________________________________________

Estimated total number of nodules >/= 1 cm: 0

Number of spongiform nodules >/=  2 cm not described below (TR1): 0

Number of mixed cystic and solid nodules >/= 1.5 cm not described
below (TR2): 0

_________________________________________________________

Oub-3 cm mixed cystic and solid (TR-2) appearing nodules are present
and demonstrated, none of which meet threshold for follow-up nor
biopsy.

No cervical adenopathy or abnormal fluid collection within imaged
neck.
IMPRESSION: 1. Nonenlarged thyroid gland
2. Oub-3 cm mixed cystic and solid (TR-2) nodules. None meet
threshold for follow-up nor biopsy per current criteria.

The above is in keeping with the ACR TI-RADS recommendations - [HOSPITAL] 4184;[DATE].

## 2022-10-24 NOTE — Telephone Encounter (Signed)
Patients son called and was given results

## 2022-10-24 NOTE — Telephone Encounter (Addendum)
Patient son called in to inform us that his son tested positive for strep throat with the same sxs as the patient and asked if there was anything we could do given that information.   Did also deliver Xray results and son agreed and Orthopedist visit would be a good idea and agreed to the referral  Call the son back at [phone]

## 2022-10-24 NOTE — Addendum Note (Signed)
Addended by: Merri Ray R on: 10/24/2022 09:20 PM   Modules accepted: Orders

## 2022-10-24 NOTE — Telephone Encounter (Signed)
Noted about orthopedics referral, I will place order.  I saw patient 1 week ago with flulike symptoms 10 days prior.  He was improving at that time with slight cough only, previous runny nose, congestion and headache were improving.  Is the patient still symptomatic and question regarding strep throat treatment for patient or question about treatment for son?  Thanks

## 2022-10-25 ENCOUNTER — Encounter: Payer: Self-pay | Admitting: Family

## 2022-10-25 ENCOUNTER — Ambulatory Visit (INDEPENDENT_AMBULATORY_CARE_PROVIDER_SITE_OTHER): Payer: Commercial Managed Care - HMO | Admitting: Family

## 2022-10-25 VITALS — BP 109/71 | HR 75 | Temp 97.5°F | Ht 66.0 in | Wt 130.0 lb

## 2022-10-25 DIAGNOSIS — J029 Acute pharyngitis, unspecified: Secondary | ICD-10-CM

## 2022-10-25 DIAGNOSIS — R053 Chronic cough: Secondary | ICD-10-CM | POA: Diagnosis not present

## 2022-10-25 LAB — POCT RAPID STREP A (OFFICE): Rapid Strep A Screen: NEGATIVE

## 2022-10-25 MED ORDER — AMOXICILLIN 500 MG PO CAPS: 500.0000 mg | ORAL_CAPSULE | Freq: Two times a day (BID) | ORAL | 0 refills | Status: AC

## 2022-10-25 MED ORDER — IBUPROFEN 600 MG PO TABS: 600.0000 mg | ORAL_TABLET | Freq: Three times a day (TID) | ORAL | 0 refills | Status: AC | PRN

## 2022-10-25 NOTE — Telephone Encounter (Signed)
Sore throat has continued other sxs have continued to improve

## 2022-10-25 NOTE — Telephone Encounter (Signed)
Noted.  With persistent sore throat and exposure to strep throat, I will send in antibiotic.  If any difficulty swallowing, fevers, or any worsening symptoms should be seen in person.

## 2022-10-25 NOTE — Patient Instructions (Addendum)
It was very nice to see you today!   Your strep test is negative. You do not need to take the antibiotic sent to your pharmacy. Increase the Flonase nasal spray to 1 squirt each side twice a day. Do NOT sniff this. Use saline nasal spray first (ok to sniff this) prior to using the Flonase. And ok to use this during day as well as a disinfectant. If symptoms are not improving, then add over the counter antihistamine like generic Xyzal or Claritin.  I have sent over RX strength Ibuprofen to take for sore throat pain and fever. I recommend taking at least in the evenings if this is when his sx are worse & he has a fever.  Drink at least 2 liters of water daily.    PLEASE NOTE:  If you had any lab tests please let us know if you have not heard back within a few days. You may see your results on MyChart before we have a chance to review them but we will give you a call once they are reviewed by [place]. If we ordered any referrals today, please let us know if you have not heard from their office within the next week.

## 2022-10-25 NOTE — Progress Notes (Signed)
Patient ID: Chris Sutton, male    DOB: Dec 25, 1951, 71 y.o.   MRN: [id number]  Chief Complaint  Patient presents with   Sore Throat    Pt c/o sore throat for about a month. Grandson tested positive for strep.     HPI:      Sore throat/Cough: sx for over 20 days, reports it started as a cough, then has turned into sore throat, pt grandson was positive for strep throat and he called his PCP who called in AMOX. Pt reports cough during day, and at night when lying down. Denies any heartburn, epigastric pain, no nausea, no history of asthma. Grandson reports pt does have allergies including pets.      Assessment & Plan:  1. Sore throat - rapid strep negative.  Sending Ibuprofen to help with throat pain, fever, other aches. Advised on warm salt water gargles, and can use OTC throat lozenges.   - POCT rapid strep A - ibuprofen (ADVIL) 600 MG tablet; Take 1 tablet (600 mg total) by mouth every 8 (eight) hours as needed for moderate pain, fever or headache.  Dispense: 30 tablet; Refill: 0  2. Persistent cough for 3 weeks or longer -  Believe his sx are from postnasal drip. Increase Flonase nasal spray to bid, ok to add OTC generic antihistamine if needed.    Subjective:    Outpatient Medications Prior to Visit  Medication Sig Dispense Refill   amoxicillin (AMOXIL) 500 MG capsule Take 1 capsule (500 mg total) by mouth 2 (two) times daily for 10 days. 20 capsule 0   fluticasone (FLONASE) 50 MCG/ACT nasal spray Place 2 sprays into both nostrils daily. 48 g 6   Multiple Vitamin (MULTIVITAMIN) tablet Take 1 tablet by mouth daily.     triamcinolone cream (KENALOG) 0.1 % Apply 1 Application topically 2 (two) times daily. 45 g 5   No facility-administered medications prior to visit.   Past Medical History:  Diagnosis Date   Rash    No past surgical history on file. No Known Allergies    Objective:    Physical Exam Vitals and nursing note reviewed.  Constitutional:      General: He is not  in acute distress.    Appearance: Normal appearance. He is not ill-appearing.  HENT:     Head: Normocephalic.     Right Ear: Tympanic membrane and ear canal normal.     Left Ear: Tympanic membrane and ear canal normal.     Nose:     Right Sinus: No maxillary sinus tenderness or frontal sinus tenderness.     Left Sinus: No maxillary sinus tenderness or frontal sinus tenderness.     Mouth/Throat:     Mouth: Mucous membranes are moist.     Pharynx: Oropharyngeal exudate and posterior oropharyngeal erythema (mild) present. No pharyngeal swelling or uvula swelling.     Tonsils: No tonsillar exudate or tonsillar abscesses.  Cardiovascular:     Rate and Rhythm: Normal rate and regular rhythm.  Pulmonary:     Effort: Pulmonary effort is normal.     Breath sounds: Normal breath sounds.  Musculoskeletal:        General: Normal range of motion.     Cervical back: Normal range of motion.  Lymphadenopathy:     Head:     Right side of head: No preauricular or posterior auricular adenopathy.     Left side of head: No preauricular or posterior auricular adenopathy.     Cervical: No cervical  adenopathy.  Skin:    General: Skin is warm and dry.  Neurological:     Mental Status: He is alert and oriented to person, place, and time.  Psychiatric:        Mood and Affect: Mood normal.    BP 109/71 (BP Location: Left Arm, Patient Position: Sitting, Cuff Size: Large)   Pulse 75   Temp (!) 97.5 F (36.4 C) (Temporal)   Ht 5\' 6"  (1.676 m)   Wt 130 lb (59 kg)   SpO2 97%   BMI 20.98 kg/m  Wt Readings from Last 3 Encounters:  10/25/22 130 lb (59 kg)  10/17/22 128 lb (58.1 kg)  04/17/22 126 lb 12.8 oz (57.5 kg)       Jeanie Sewer, NP

## 2022-10-25 NOTE — Telephone Encounter (Signed)
Based on time since initial symptoms and the fact he was improving at last visit, may not need to change treatment.  If he is still having symptoms, especially any sore throat I certainly can send in treatment for exposure to strep throat.  Let me know.  Thanks

## 2022-10-25 NOTE — Telephone Encounter (Signed)
Son was advised and thanked [place] for our assistance

## 2022-10-25 NOTE — Telephone Encounter (Signed)
Patient son called stating his son (pt grandson) had strep and wanted to know if this effected how you treated the patient going forward, did make an appt at [facility] for 3 to see a NP but son stated they would rather not see the NP and just have you treat

## 2022-10-25 NOTE — Addendum Note (Signed)
Addended by: Merri Ray R on: 10/25/2022 01:22 PM   Modules accepted: Orders

## 2022-10-29 ENCOUNTER — Ambulatory Visit: Payer: Commercial Managed Care - HMO | Admitting: Orthopaedic Surgery

## 2023-04-14 ENCOUNTER — Ambulatory Visit: Payer: Self-pay | Admitting: Family Medicine
